# Patient Record
Sex: Female | Born: 1965 | Race: White | Hispanic: No | Marital: Married | Smoking: Never smoker
Health system: Southern US, Community
[De-identification: ages and names within clinical notes are randomized; demographics above are authoritative.]

## PROBLEM LIST (undated history)

## (undated) DIAGNOSIS — G4700 Insomnia, unspecified: Secondary | ICD-10-CM

## (undated) DIAGNOSIS — I10 Essential (primary) hypertension: Secondary | ICD-10-CM

## (undated) DIAGNOSIS — E0590 Thyrotoxicosis, unspecified without thyrotoxic crisis or storm: Secondary | ICD-10-CM

## (undated) DIAGNOSIS — R197 Diarrhea, unspecified: Secondary | ICD-10-CM

## (undated) DIAGNOSIS — E785 Hyperlipidemia, unspecified: Secondary | ICD-10-CM

## (undated) DIAGNOSIS — R109 Unspecified abdominal pain: Secondary | ICD-10-CM

## (undated) DIAGNOSIS — F418 Other specified anxiety disorders: Secondary | ICD-10-CM

## (undated) DIAGNOSIS — E1129 Type 2 diabetes mellitus with other diabetic kidney complication: Secondary | ICD-10-CM

## (undated) DIAGNOSIS — N182 Chronic kidney disease, stage 2 (mild): Secondary | ICD-10-CM

## (undated) DIAGNOSIS — F411 Generalized anxiety disorder: Secondary | ICD-10-CM

## (undated) DIAGNOSIS — E7800 Pure hypercholesterolemia, unspecified: Secondary | ICD-10-CM

## (undated) DIAGNOSIS — E669 Obesity, unspecified: Secondary | ICD-10-CM

## (undated) HISTORY — DX: Obesity, unspecified: E66.9

## (undated) HISTORY — DX: Chronic kidney disease, stage 2 (mild): N18.2

## (undated) HISTORY — DX: Unspecified abdominal pain: R10.9

## (undated) HISTORY — DX: Essential (primary) hypertension: I10

## (undated) HISTORY — DX: Generalized anxiety disorder: F41.1

## (undated) HISTORY — DX: Diarrhea, unspecified: R19.7

## (undated) HISTORY — DX: Pure hypercholesterolemia, unspecified: E78.00

## (undated) HISTORY — PX: CHOLECYSTECTOMY: SHX55

## (undated) HISTORY — DX: Thyrotoxicosis, unspecified without thyrotoxic crisis or storm: E05.90

## (undated) HISTORY — DX: Type 2 diabetes mellitus with other diabetic kidney complication: E11.29

## (undated) HISTORY — DX: Other specified anxiety disorders: F41.8

## (undated) HISTORY — DX: Insomnia, unspecified: G47.00

## (undated) HISTORY — DX: Hyperlipidemia, unspecified: E78.5

---

## 1997-12-08 ENCOUNTER — Encounter: Payer: Self-pay | Admitting: Family Medicine

## 1997-12-08 ENCOUNTER — Ambulatory Visit (HOSPITAL_COMMUNITY): Admission: RE | Admit: 1997-12-08 | Discharge: 1997-12-08 | Payer: Self-pay | Admitting: Family Medicine

## 1998-09-07 ENCOUNTER — Ambulatory Visit (HOSPITAL_COMMUNITY): Admission: RE | Admit: 1998-09-07 | Discharge: 1998-09-07 | Payer: Self-pay | Admitting: Gastroenterology

## 1998-09-22 ENCOUNTER — Encounter: Payer: Self-pay | Admitting: Gastroenterology

## 1998-09-22 ENCOUNTER — Ambulatory Visit (HOSPITAL_COMMUNITY): Admission: RE | Admit: 1998-09-22 | Discharge: 1998-09-22 | Payer: Self-pay | Admitting: Gastroenterology

## 1998-10-13 ENCOUNTER — Ambulatory Visit (HOSPITAL_COMMUNITY): Admission: RE | Admit: 1998-10-13 | Discharge: 1998-10-13 | Payer: Self-pay | Admitting: Urology

## 1998-10-13 ENCOUNTER — Encounter: Payer: Self-pay | Admitting: Urology

## 1998-11-08 ENCOUNTER — Encounter: Payer: Self-pay | Admitting: Urology

## 1998-11-08 ENCOUNTER — Ambulatory Visit (HOSPITAL_COMMUNITY): Admission: RE | Admit: 1998-11-08 | Discharge: 1998-11-08 | Payer: Self-pay | Admitting: Urology

## 1998-12-26 ENCOUNTER — Observation Stay (HOSPITAL_COMMUNITY): Admission: RE | Admit: 1998-12-26 | Discharge: 1998-12-27 | Payer: Self-pay | Admitting: Urology

## 1998-12-26 ENCOUNTER — Encounter: Payer: Self-pay | Admitting: Urology

## 1999-02-08 ENCOUNTER — Encounter: Payer: Self-pay | Admitting: Urology

## 1999-02-08 ENCOUNTER — Ambulatory Visit (HOSPITAL_COMMUNITY): Admission: RE | Admit: 1999-02-08 | Discharge: 1999-02-08 | Payer: Self-pay | Admitting: Urology

## 1999-08-21 ENCOUNTER — Encounter: Payer: Self-pay | Admitting: Emergency Medicine

## 1999-08-21 ENCOUNTER — Emergency Department (HOSPITAL_COMMUNITY): Admission: EM | Admit: 1999-08-21 | Discharge: 1999-08-21 | Payer: Self-pay | Admitting: Emergency Medicine

## 1999-12-25 ENCOUNTER — Encounter: Payer: Self-pay | Admitting: Obstetrics and Gynecology

## 1999-12-25 ENCOUNTER — Ambulatory Visit (HOSPITAL_COMMUNITY): Admission: RE | Admit: 1999-12-25 | Discharge: 1999-12-25 | Payer: Self-pay | Admitting: Obstetrics and Gynecology

## 2000-03-24 ENCOUNTER — Inpatient Hospital Stay (HOSPITAL_COMMUNITY): Admission: AD | Admit: 2000-03-24 | Discharge: 2000-03-24 | Payer: Self-pay | Admitting: Obstetrics and Gynecology

## 2000-05-03 ENCOUNTER — Inpatient Hospital Stay (HOSPITAL_COMMUNITY): Admission: AD | Admit: 2000-05-03 | Discharge: 2000-05-05 | Payer: Self-pay | Admitting: Obstetrics and Gynecology

## 2000-09-03 ENCOUNTER — Encounter: Payer: Self-pay | Admitting: Family Medicine

## 2000-09-03 ENCOUNTER — Ambulatory Visit (HOSPITAL_COMMUNITY): Admission: RE | Admit: 2000-09-03 | Discharge: 2000-09-03 | Payer: Self-pay | Admitting: Family Medicine

## 2000-11-20 ENCOUNTER — Emergency Department (HOSPITAL_COMMUNITY): Admission: EM | Admit: 2000-11-20 | Discharge: 2000-11-20 | Payer: Self-pay | Admitting: Emergency Medicine

## 2000-12-04 ENCOUNTER — Encounter: Payer: Self-pay | Admitting: Family Medicine

## 2000-12-04 ENCOUNTER — Ambulatory Visit (HOSPITAL_COMMUNITY): Admission: RE | Admit: 2000-12-04 | Discharge: 2000-12-04 | Payer: Self-pay | Admitting: Family Medicine

## 2001-06-06 ENCOUNTER — Emergency Department: Admission: EM | Admit: 2001-06-06 | Discharge: 2001-06-07 | Payer: Self-pay | Admitting: Emergency Medicine

## 2001-06-07 ENCOUNTER — Emergency Department (HOSPITAL_COMMUNITY): Admission: EM | Admit: 2001-06-07 | Discharge: 2001-06-07 | Payer: Self-pay | Admitting: Emergency Medicine

## 2001-06-09 ENCOUNTER — Ambulatory Visit (HOSPITAL_COMMUNITY): Admission: RE | Admit: 2001-06-09 | Discharge: 2001-06-09 | Payer: Self-pay | Admitting: Family Medicine

## 2001-06-09 ENCOUNTER — Encounter: Payer: Self-pay | Admitting: Family Medicine

## 2003-01-31 ENCOUNTER — Emergency Department (HOSPITAL_COMMUNITY): Admission: EM | Admit: 2003-01-31 | Discharge: 2003-02-01 | Payer: Self-pay | Admitting: Emergency Medicine

## 2003-09-03 ENCOUNTER — Ambulatory Visit (HOSPITAL_COMMUNITY): Admission: RE | Admit: 2003-09-03 | Discharge: 2003-09-03 | Payer: Self-pay | Admitting: Family Medicine

## 2003-09-10 ENCOUNTER — Ambulatory Visit (HOSPITAL_COMMUNITY): Admission: RE | Admit: 2003-09-10 | Discharge: 2003-09-10 | Payer: Self-pay | Admitting: Family Medicine

## 2003-09-22 ENCOUNTER — Ambulatory Visit (HOSPITAL_COMMUNITY): Admission: RE | Admit: 2003-09-22 | Discharge: 2003-09-22 | Payer: Self-pay | Admitting: Urology

## 2003-10-07 ENCOUNTER — Ambulatory Visit (HOSPITAL_COMMUNITY): Admission: RE | Admit: 2003-10-07 | Discharge: 2003-10-07 | Payer: Self-pay | Admitting: General Surgery

## 2003-10-07 ENCOUNTER — Encounter (INDEPENDENT_AMBULATORY_CARE_PROVIDER_SITE_OTHER): Payer: Self-pay | Admitting: *Deleted

## 2005-04-29 ENCOUNTER — Encounter: Payer: Self-pay | Admitting: Emergency Medicine

## 2005-04-30 ENCOUNTER — Inpatient Hospital Stay (HOSPITAL_COMMUNITY): Admission: AD | Admit: 2005-04-30 | Discharge: 2005-04-30 | Payer: Self-pay | Admitting: Internal Medicine

## 2006-01-04 IMAGING — NM NM RENAL IMAGING FLOW W/ PHARM
1 series · 6 of 6 positions shown · non-contrast
Comparison: none

CLINICAL DATA: right ureteropelvic junction obstruction
 RENAL IMAGING WITH FLOW WITH PHARMACY
 The study was performed after initial injection of 16.2 mCi of ZcYYm MAG3.  20mg of Lasix was injected IV twenty minutes after injection of isotope.
 The study was compared with a previous renogram of 02/08/99.  
 Initial flow study reveals prompt visualization of the collecting system with satisfactory excretion on the left with decreased excretion on the right.
 At three minutes a renal cell mass on the left is 46.7% and on the right 53.3%.
 Flow study reveals satisfactory perfusion of both kidneys.
 There is satisfactory excretion of the left kidney before and after Lasix.  On the right no excretion is present prior to Lasix.  After Lasix there is satisfactory excretion.  This is similar to the previous study.  Clearance results reveal T [DATE] of the Lasix on the left to be three minutes and on the right to be eleven minutes.  On the previous study the T [DATE] on the right was six minutes.
 IMPRESSION
 When compared to previous study of 02/08/99 again there satisfactory excretion of the left kidney.  The right kidney as on the previous study no excretion is noted prior to Lasix.  After Lasix there is noted to be more delay in excretion of the right kidney when compared to the previous study.  T [DATE] previously on the right was six minutes and on the present study is eleven minutes.

[renal scan · 8.64mm/px · 6 of 40 frames shown]
[frame 4/40]
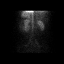
[frame 10/40]
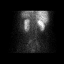
[frame 17/40]
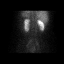
[frame 24/40]
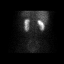
[frame 30/40]
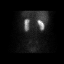
[frame 37/40]
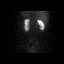

[6 of 6 positions shown; findings below may reference images not displayed]

## 2007-11-06 ENCOUNTER — Encounter: Admission: RE | Admit: 2007-11-06 | Discharge: 2007-11-06 | Payer: Self-pay | Admitting: Orthopedic Surgery

## 2010-02-08 ENCOUNTER — Other Ambulatory Visit
Admission: RE | Admit: 2010-02-08 | Discharge: 2010-02-08 | Payer: Self-pay | Source: Home / Self Care | Admitting: Family Medicine

## 2010-02-08 ENCOUNTER — Other Ambulatory Visit: Payer: Self-pay | Admitting: Family Medicine

## 2010-06-09 NOTE — Discharge Summary (Signed)
[facility]  Patient:    [name], [name]                       MRN: [id number] Adm. Date:  [id number] Disc. Date: 05/05/00 Attending:  Michaele Offer                           Discharge Summary  ADMITTING DIAGNOSIS:          Intrauterine pregnancy at 36 weeks, preterm labor.  DISCHARGE DIAGNOSIS:          Intrauterine pregnancy at 36 weeks, preterm labor.  PROCEDURE:                    Spontaneous vaginal delivery.  COMPLICATIONS:                None.  CONSULTANTS:                  None.  HISTORY OF PRESENT ILLNESS:   This is a 45 year old white female, gravida 5, para 3-0-1-3, with an EGA of 36+ weeks by seven week ultrasound with an [facility] of May 27, 2000 who presents with a complaint of regular contraction without bleeding or rupture membranes, and with good fetal movement.  Vaginal exam two days ago was 3 cm dilated and today in the office she was 4 cm dilated and in triage she progressed to 6 to 7 cm dilated.  Prenatal care was subsequently uncomplicated.  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  Prenatal labs show blood type is O positive with a negative antibody screen.  RPR nonreactive.  Rubella immuned.  Hepatitis B surface-antigen negative.  HIV negative.  Gonorrhea and chlamydia negative.  Triple screen normal.  One hour Glucola 131 and group B strep is negative.  PAST OBSTETRIC HISTORY:       In 1988, spontaneous abortion.  In 1989, vaginal delivery at 40 weeks, 6 pounds 11 ounces, no complications.  In 1991, vaginal delivery at 40 weeks, 7 pounds 3 ounces, no complications.  In 1998, vaginal delivery at 37 weeks, 6 pounds 7 ounces, no complications.  GYNECOLOGIC HISTORY:          Rare HSV.  PAST MEDICAL HISTORY:         Fractured coccyx.  PAST SURGICAL HISTORY:        Tonsillectomy, eye surgery, D&C after the spontaneous abortion.  Removal of her coccyx and a right ureteral stent.   The remainder of her history is noncontributory.  PHYSICAL EXAMINATION:  VITAL SIGNS:                  She was afebrile with stable vital signs.  Fetal heart tracing was reactive with contractions every three to five minutes.  ABDOMEN:                      Gravid and nontender with estimated fetal weight of 7 pounds.  VAGINAL:                      By myself, she was 650 and 0 with a vertex presentation and an adequate pelvis, and artificial rupture of membranes revealed clear amniotic fluid.  HOSPITAL COURSE:              The patient was admitted and initially labored on her own.  She  had a reactive tracing and was allowed to walk.  After two episodes of walking and making no progress, I was able to examine her and perform artificial rupture of membranes.  This did not significant improve her contractions or help her make any progress.  She was then started on Pitocin augmentation and received and epidural.  She finally progressed rapidly to complete and pushed well.  Early on the morning of May 04, 2000, she had an SVD of a viable female infant without Apgars of 8 and 9 and weight 6 pounds 13 ounces over an intact perineum.  There was a loose nuchal cord x 1 which was reduced.  Placenta delivered spontaneously and intact.  The perineum was intact and estimated blood loss was less than 500 cc.  Postpartum, she did very well.  Breast-fed her baby without complications.  A lower hemoglobin, it was 12.8 postdelivery and was 11.6.  On the morning of postpartum day #1, the patient requested discharge home. She was felt to be stable enough for discharge.  CONDITION ON DISCHARGE:       Stable.  DISPOSITION:                  Discharge to home.  DISCHARGE INSTRUCTIONS:       Regular diet.  Her activity is pelvic rest.  Her follow-up is in four to six weeks and she is given our discharge pamphlet. DD:  05/05/00 TD:  05/05/00 Job: [id number]

## 2010-06-09 NOTE — Op Note (Signed)
[name], [name]                          ACCOUNT NO.:  [id number]   MEDICAL RECORD NO.:  [id number]                   PATIENT TYPE:  AMB   LOCATION:  DAY                                  FACILITY:  [facility]   PHYSICIAN:  [name], M.D.               DATE OF BIRTH:  February 11, 1965   DATE OF PROCEDURE:  10/07/2003  DATE OF DISCHARGE:                                 OPERATIVE REPORT   PREOPERATIVE DIAGNOSIS:  Symptomatic cholelithiasis.   POSTOPERATIVE DIAGNOSIS:  Symptomatic cholelithiasis.   PROCEDURE:  Laparoscopic cholecystectomy with intraoperative cholangiogram.   SURGEON:  Dr. Lindie Bass   ASSISTANT:  Dr. Carman Bass   ANESTHESIA:  General endotracheal.   ESTIMATED BLOOD LOSS:  Less than 20 mL.   COMPLICATIONS:  None.   CONDITION:  Good.   INDICATION FOR OPERATION:  The patient is a 45 year old with abdominal pain  in right upper quadrant pain and gallstones, who now comes in for an  elective laparoscopic cholecystectomy.   FINDINGS:  On initial visualization of the gallbladder, there were omental  adhesions to the body which had to be taken down with evidence of  inflammation of the infundibulum.  The cholangiogram showed normal flow into  the duodenum, good proximal filling, and no evidence of intraductal stones  or common duct dilatation.   OPERATION:  The patient was taken to the operating room, placed on the table  in a supine position.  After an adequate endotracheal anesthetic was  administered, she was prepped and draped in the usual sterile manner after  the patient had undergone cystoscopy and manipulation by Dr. Patsi Bass.   A supraumbilical curvilinear incision was made with the patient in reverse  Trendelenburg position.  This was taken down to the midline fascia through  which a Veress needle was passed into the peritoneal cavity.  It was  confirmed to be in position using the saline test and once this was done,  carbon dioxide insufflation was  instilled into the peritoneal cavity up to a  maximal intra-abdominal pressure of 15 mmHg.  When this was done, a 10-11 mm  trocar and cannula with an OptiView port was passed through the  supraumbilical fascia, into the peritoneal cavity under direct vision.  We  removed the inner trocar and then subsequently had the cannula in place  through which we inspected insertion of two right costal margin 5 mm  cannulas and a subxiphoid 10-11 mm cannula under direct vision.  The patient  was placed in steep reverse Trendelenburg.  Then the left side was tilted  down.   The dome of the gallbladder was grasped with a ratcheted grasper through the  lateral-most cannula, and the omentum which was attached to the body was  taken down using electrocautery and blunt dissection.  This helped [place] to  expose the infundibulum which upon retracting it inferolaterally, exposed  the peritoneum overlying the triangle of Calot  and hepatoduodenal triangle.  We were able to identify the cystic duct and the cystic artery in this area  along with a large cystic duct lymph node.  We inspected the cystic duct and  dissected it out using right-angle clamps and blunt dissection and  subsequently placed a proximal clip along the gallbladder side.  A  cholecystoductotomy was made using laparoscopic scissors through which a  Cook catheter, which had been passed through the anterior abdominal wall,  was passed.  We did our cholangiogram through the [facility] catheter which  demonstrated the findings noted previously; however, overall, it was normal.  Once it was done, the patient was placed back into the operating position.  The [facility] catheter was removed.  The distal cystic duct was clipped with 3  Endo Clips and then transected.  The cystic duct was then found which  coursed along with the cystic duct lymph node and clipped proximally and  distally.  We transected the cystic artery and then dissected out the  gallbladder from  its bed with minimal difficulty, although there was a small  puncture of the gallbladder along with leakage of bowel.  We were able to  remove it through the supraumbilical site using an EndoCatch bag.   Once the gallbladder was removed, we used 1 L of saline to irrigate out the  spilled bile from the opening that was done during the dissection.  There  was minimal to no bleeding from the gallbladder bed.  Once the gallbladder  had been removed and we had had adequate hemostasis, we removed all  cannulas.  There was minimal bleeding.  The supraumbilical fascia was closed  using a figure-of-eight stitch of 0 Vicryl passed on a UR-6 needle and then  the skin and all sites closed using a running subcuticular stitch of 4-0  Vicryl.  Marcaine 0.25% with epinephrine was injected in all sites and then  sterile dressings applied.  All needle counts, sponge counts, and instrument  counts were correct at the conclusion of the case.                                               [name], M.D.    JW/MEDQ  D:  10/07/2003  T:  10/07/2003  Job:  [id number]

## 2010-06-09 NOTE — Op Note (Signed)
NAME:  [name], [name]                          ACCOUNT NO.:  [id number]   MEDICAL RECORD NO.:  [id number]                   PATIENT TYPE:  AMB   LOCATION:  DAY                                  FACILITY:  [facility]   PHYSICIAN:  Sigmund I. Patsi Sears, M.D.         DATE OF BIRTH:  1965-11-29   DATE OF PROCEDURE:  10/07/2003  DATE OF DISCHARGE:                                 OPERATIVE REPORT   PREOPERATIVE DIAGNOSIS:  Dilated right renal unit.   POSTOPERATIVE DIAGNOSIS:  Normally draining right renal unit, status post  endopyelotomy.   OPERATION:  1.  Cystourethroscopy.  2.  Right retrograde pyelogram with interpretation.  3.  Right ureteroscopy.  4.  Right double-J catheter.   SURGEON:  Sigmund I. Patsi Sears, M.D.   ANESTHESIA:  General LMA.   PREPARATION:  After appropriate preanesthesia, the patient is brought to the  operating room and placed on the operating table in the dorsal supine  position where general LMA anesthesia was introduced.  She was then re-  placed in dorsal lithotomy position, where the pubis was prepped with  Betadine solution and draped in usual fashion.   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  During evaluation for  gallstones, she was found to have right-sided hydronephrosis, was considered  for open pyeloplasty.  However, reevaluation of the CT scan showed that the  patient had hydronephrosis down to the mid ureter.  She is now for a  retrograde pyelography and ureteroscopy and double-J catheter stenting  during her procedure for cholecystectomy.   DESCRIPTION OF PROCEDURE:  Cystourethroscopy is accomplished and shows a  normal-appearing bladder.  Right retrograde pyelogram is performed and shows  apparent hydronephrosis with dilated ureter.  However, fluoroscopic  evaluation shows excellent drainage of both the renal pelvis and of the mid  ureter.  It is felt that the  patient had excellent endopyelotomy with  excellent drainage.  Ureteroscopy is accomplished which shows that there is  normal ureteral mucosa all the way into the renal pelvis.  The entire renal  pelvis is evaluation, and there is no evidence of any obstruction.   Double-J catheter was left, removed in the office.  The patient then  underwent laparoscopic cholecystectomy for Dr. Lindie Spruce.                                               Sigmund I. Patsi Sears, M.D.    SIT/MEDQ  D:  10/07/2003  T:  10/07/2003  Job:  [id number]   cc:   Jimmye Norman III, M.D.  [address], [address]  [place]  [place] [address]  Fax: [phone]

## 2010-06-20 ENCOUNTER — Other Ambulatory Visit: Payer: Self-pay | Admitting: Gastroenterology

## 2010-08-14 ENCOUNTER — Other Ambulatory Visit: Payer: Self-pay | Admitting: Gastroenterology

## 2010-08-17 ENCOUNTER — Ambulatory Visit
Admission: RE | Admit: 2010-08-17 | Discharge: 2010-08-17 | Disposition: A | Discharge status: Home / Self Care | Payer: Medicaid Other | Source: Ambulatory Visit | Attending: Gastroenterology | Admitting: Gastroenterology

## 2010-08-17 MED ORDER — IOHEXOL 300 MG/ML  SOLN
100.0000 mL | Freq: Once | INTRAMUSCULAR | Status: AC | PRN
  Administered 2010-08-17: 100 mL via INTRAVENOUS

## 2010-09-15 ENCOUNTER — Other Ambulatory Visit: Payer: Self-pay | Admitting: Orthopedic Surgery

## 2010-09-15 DIAGNOSIS — M545 Low back pain: Secondary | ICD-10-CM

## 2010-10-02 ENCOUNTER — Other Ambulatory Visit: Payer: Medicaid Other

## 2010-10-16 ENCOUNTER — Other Ambulatory Visit: Payer: Medicaid Other

## 2010-10-23 ENCOUNTER — Inpatient Hospital Stay: Admission: RE | Admit: 2010-10-23 | Payer: Medicaid Other | Source: Ambulatory Visit

## 2010-10-30 ENCOUNTER — Other Ambulatory Visit: Payer: Medicaid Other

## 2010-10-31 ENCOUNTER — Inpatient Hospital Stay: Admission: RE | Admit: 2010-10-31 | Payer: Medicaid Other | Source: Ambulatory Visit

## 2012-08-11 ENCOUNTER — Other Ambulatory Visit (HOSPITAL_COMMUNITY): Payer: Self-pay | Admitting: Urology

## 2012-08-11 DIAGNOSIS — N3020 Other chronic cystitis without hematuria: Secondary | ICD-10-CM | POA: Diagnosis not present

## 2012-08-11 DIAGNOSIS — N135 Crossing vessel and stricture of ureter without hydronephrosis: Secondary | ICD-10-CM

## 2012-08-18 ENCOUNTER — Encounter (HOSPITAL_COMMUNITY): Admission: RE | Admit: 2012-08-18 | Payer: 59 | Source: Ambulatory Visit

## 2012-08-22 DIAGNOSIS — E119 Type 2 diabetes mellitus without complications: Secondary | ICD-10-CM | POA: Diagnosis not present

## 2012-09-16 DIAGNOSIS — E119 Type 2 diabetes mellitus without complications: Secondary | ICD-10-CM | POA: Diagnosis not present

## 2012-09-16 DIAGNOSIS — I10 Essential (primary) hypertension: Secondary | ICD-10-CM | POA: Diagnosis not present

## 2012-09-16 DIAGNOSIS — E785 Hyperlipidemia, unspecified: Secondary | ICD-10-CM | POA: Diagnosis not present

## 2012-09-16 DIAGNOSIS — G4700 Insomnia, unspecified: Secondary | ICD-10-CM | POA: Diagnosis not present

## 2012-09-16 DIAGNOSIS — F411 Generalized anxiety disorder: Secondary | ICD-10-CM | POA: Diagnosis not present

## 2013-02-27 DIAGNOSIS — N182 Chronic kidney disease, stage 2 (mild): Secondary | ICD-10-CM | POA: Diagnosis not present

## 2013-02-27 DIAGNOSIS — G4700 Insomnia, unspecified: Secondary | ICD-10-CM | POA: Diagnosis not present

## 2013-02-27 DIAGNOSIS — I10 Essential (primary) hypertension: Secondary | ICD-10-CM | POA: Diagnosis not present

## 2013-02-27 DIAGNOSIS — E1129 Type 2 diabetes mellitus with other diabetic kidney complication: Secondary | ICD-10-CM | POA: Diagnosis not present

## 2013-02-27 DIAGNOSIS — F411 Generalized anxiety disorder: Secondary | ICD-10-CM | POA: Diagnosis not present

## 2013-02-27 DIAGNOSIS — E785 Hyperlipidemia, unspecified: Secondary | ICD-10-CM | POA: Diagnosis not present

## 2013-09-07 DIAGNOSIS — M545 Low back pain, unspecified: Secondary | ICD-10-CM | POA: Diagnosis not present

## 2013-09-07 DIAGNOSIS — F411 Generalized anxiety disorder: Secondary | ICD-10-CM | POA: Diagnosis not present

## 2013-09-07 DIAGNOSIS — I10 Essential (primary) hypertension: Secondary | ICD-10-CM | POA: Diagnosis not present

## 2013-09-07 DIAGNOSIS — G4700 Insomnia, unspecified: Secondary | ICD-10-CM | POA: Diagnosis not present

## 2013-09-07 DIAGNOSIS — E1129 Type 2 diabetes mellitus with other diabetic kidney complication: Secondary | ICD-10-CM | POA: Diagnosis not present

## 2013-09-07 DIAGNOSIS — N182 Chronic kidney disease, stage 2 (mild): Secondary | ICD-10-CM | POA: Diagnosis not present

## 2013-09-07 DIAGNOSIS — E785 Hyperlipidemia, unspecified: Secondary | ICD-10-CM | POA: Diagnosis not present

## 2016-04-30 ENCOUNTER — Other Ambulatory Visit: Payer: Self-pay | Admitting: Family Medicine

## 2016-04-30 ENCOUNTER — Ambulatory Visit
Admission: RE | Admit: 2016-04-30 | Discharge: 2016-04-30 | Disposition: A | Discharge status: Home / Self Care | Payer: Medicare PPO | Source: Ambulatory Visit | Attending: Family Medicine | Admitting: Family Medicine

## 2016-04-30 DIAGNOSIS — M79645 Pain in left finger(s): Secondary | ICD-10-CM

## 2016-12-03 DIAGNOSIS — I10 Essential (primary) hypertension: Secondary | ICD-10-CM | POA: Diagnosis not present

## 2016-12-03 DIAGNOSIS — F411 Generalized anxiety disorder: Secondary | ICD-10-CM | POA: Diagnosis not present

## 2016-12-03 DIAGNOSIS — G4700 Insomnia, unspecified: Secondary | ICD-10-CM | POA: Diagnosis not present

## 2016-12-03 DIAGNOSIS — E785 Hyperlipidemia, unspecified: Secondary | ICD-10-CM | POA: Diagnosis not present

## 2016-12-03 DIAGNOSIS — E1122 Type 2 diabetes mellitus with diabetic chronic kidney disease: Secondary | ICD-10-CM | POA: Diagnosis not present

## 2016-12-03 DIAGNOSIS — Z23 Encounter for immunization: Secondary | ICD-10-CM | POA: Diagnosis not present

## 2016-12-03 DIAGNOSIS — Z1389 Encounter for screening for other disorder: Secondary | ICD-10-CM | POA: Diagnosis not present

## 2016-12-03 DIAGNOSIS — N182 Chronic kidney disease, stage 2 (mild): Secondary | ICD-10-CM | POA: Diagnosis not present

## 2016-12-25 DIAGNOSIS — R1031 Right lower quadrant pain: Secondary | ICD-10-CM | POA: Diagnosis not present

## 2017-02-06 DIAGNOSIS — N921 Excessive and frequent menstruation with irregular cycle: Secondary | ICD-10-CM | POA: Diagnosis not present

## 2017-02-21 DIAGNOSIS — N921 Excessive and frequent menstruation with irregular cycle: Secondary | ICD-10-CM | POA: Diagnosis not present

## 2017-03-01 ENCOUNTER — Other Ambulatory Visit: Payer: Self-pay | Admitting: Obstetrics & Gynecology

## 2017-03-01 DIAGNOSIS — N841 Polyp of cervix uteri: Secondary | ICD-10-CM | POA: Diagnosis not present

## 2017-03-01 DIAGNOSIS — Z6833 Body mass index (BMI) 33.0-33.9, adult: Secondary | ICD-10-CM | POA: Diagnosis not present

## 2017-03-01 DIAGNOSIS — N858 Other specified noninflammatory disorders of uterus: Secondary | ICD-10-CM | POA: Diagnosis not present

## 2017-03-01 DIAGNOSIS — E1122 Type 2 diabetes mellitus with diabetic chronic kidney disease: Secondary | ICD-10-CM | POA: Diagnosis not present

## 2017-03-01 DIAGNOSIS — E669 Obesity, unspecified: Secondary | ICD-10-CM | POA: Diagnosis not present

## 2017-03-01 DIAGNOSIS — I10 Essential (primary) hypertension: Secondary | ICD-10-CM | POA: Diagnosis not present

## 2017-03-01 DIAGNOSIS — N926 Irregular menstruation, unspecified: Secondary | ICD-10-CM | POA: Diagnosis not present

## 2017-03-01 DIAGNOSIS — Z7984 Long term (current) use of oral hypoglycemic drugs: Secondary | ICD-10-CM | POA: Diagnosis not present

## 2017-04-01 DIAGNOSIS — D251 Intramural leiomyoma of uterus: Secondary | ICD-10-CM | POA: Diagnosis not present

## 2017-04-01 DIAGNOSIS — I10 Essential (primary) hypertension: Secondary | ICD-10-CM | POA: Diagnosis not present

## 2017-04-01 DIAGNOSIS — N939 Abnormal uterine and vaginal bleeding, unspecified: Secondary | ICD-10-CM | POA: Diagnosis not present

## 2017-04-09 DIAGNOSIS — J4521 Mild intermittent asthma with (acute) exacerbation: Secondary | ICD-10-CM | POA: Diagnosis not present

## 2017-04-09 DIAGNOSIS — R509 Fever, unspecified: Secondary | ICD-10-CM | POA: Diagnosis not present

## 2017-06-04 DIAGNOSIS — F411 Generalized anxiety disorder: Secondary | ICD-10-CM | POA: Diagnosis not present

## 2017-06-04 DIAGNOSIS — G4700 Insomnia, unspecified: Secondary | ICD-10-CM | POA: Diagnosis not present

## 2017-06-04 DIAGNOSIS — Z1389 Encounter for screening for other disorder: Secondary | ICD-10-CM | POA: Diagnosis not present

## 2017-06-04 DIAGNOSIS — N182 Chronic kidney disease, stage 2 (mild): Secondary | ICD-10-CM | POA: Diagnosis not present

## 2017-06-04 DIAGNOSIS — E785 Hyperlipidemia, unspecified: Secondary | ICD-10-CM | POA: Diagnosis not present

## 2017-06-04 DIAGNOSIS — I10 Essential (primary) hypertension: Secondary | ICD-10-CM | POA: Diagnosis not present

## 2017-06-04 DIAGNOSIS — E1122 Type 2 diabetes mellitus with diabetic chronic kidney disease: Secondary | ICD-10-CM | POA: Diagnosis not present

## 2017-06-04 DIAGNOSIS — Z0000 Encounter for general adult medical examination without abnormal findings: Secondary | ICD-10-CM | POA: Diagnosis not present

## 2017-06-04 DIAGNOSIS — K635 Polyp of colon: Secondary | ICD-10-CM | POA: Diagnosis not present

## 2017-08-01 DIAGNOSIS — R87610 Atypical squamous cells of undetermined significance on cytologic smear of cervix (ASC-US): Secondary | ICD-10-CM | POA: Diagnosis not present

## 2017-08-01 DIAGNOSIS — Z01411 Encounter for gynecological examination (general) (routine) with abnormal findings: Secondary | ICD-10-CM | POA: Diagnosis not present

## 2017-08-01 DIAGNOSIS — N939 Abnormal uterine and vaginal bleeding, unspecified: Secondary | ICD-10-CM | POA: Diagnosis not present

## 2017-08-01 DIAGNOSIS — D251 Intramural leiomyoma of uterus: Secondary | ICD-10-CM | POA: Diagnosis not present

## 2017-08-01 DIAGNOSIS — I10 Essential (primary) hypertension: Secondary | ICD-10-CM | POA: Diagnosis not present

## 2017-12-06 DIAGNOSIS — Z23 Encounter for immunization: Secondary | ICD-10-CM | POA: Diagnosis not present

## 2017-12-06 DIAGNOSIS — Z6836 Body mass index (BMI) 36.0-36.9, adult: Secondary | ICD-10-CM | POA: Diagnosis not present

## 2017-12-06 DIAGNOSIS — F411 Generalized anxiety disorder: Secondary | ICD-10-CM | POA: Diagnosis not present

## 2017-12-06 DIAGNOSIS — E1122 Type 2 diabetes mellitus with diabetic chronic kidney disease: Secondary | ICD-10-CM | POA: Diagnosis not present

## 2017-12-06 DIAGNOSIS — I10 Essential (primary) hypertension: Secondary | ICD-10-CM | POA: Diagnosis not present

## 2017-12-06 DIAGNOSIS — N182 Chronic kidney disease, stage 2 (mild): Secondary | ICD-10-CM | POA: Diagnosis not present

## 2017-12-06 DIAGNOSIS — G4700 Insomnia, unspecified: Secondary | ICD-10-CM | POA: Diagnosis not present

## 2017-12-06 DIAGNOSIS — E785 Hyperlipidemia, unspecified: Secondary | ICD-10-CM | POA: Diagnosis not present

## 2017-12-30 DIAGNOSIS — M25561 Pain in right knee: Secondary | ICD-10-CM | POA: Diagnosis not present

## 2017-12-30 DIAGNOSIS — M1711 Unilateral primary osteoarthritis, right knee: Secondary | ICD-10-CM | POA: Diagnosis not present

## 2018-04-07 DIAGNOSIS — B349 Viral infection, unspecified: Secondary | ICD-10-CM | POA: Diagnosis not present

## 2018-06-06 DIAGNOSIS — Z1389 Encounter for screening for other disorder: Secondary | ICD-10-CM | POA: Diagnosis not present

## 2018-06-06 DIAGNOSIS — F411 Generalized anxiety disorder: Secondary | ICD-10-CM | POA: Diagnosis not present

## 2018-06-06 DIAGNOSIS — N182 Chronic kidney disease, stage 2 (mild): Secondary | ICD-10-CM | POA: Diagnosis not present

## 2018-06-06 DIAGNOSIS — E1122 Type 2 diabetes mellitus with diabetic chronic kidney disease: Secondary | ICD-10-CM | POA: Diagnosis not present

## 2018-06-06 DIAGNOSIS — Z7984 Long term (current) use of oral hypoglycemic drugs: Secondary | ICD-10-CM | POA: Diagnosis not present

## 2018-06-06 DIAGNOSIS — I10 Essential (primary) hypertension: Secondary | ICD-10-CM | POA: Diagnosis not present

## 2018-06-06 DIAGNOSIS — E785 Hyperlipidemia, unspecified: Secondary | ICD-10-CM | POA: Diagnosis not present

## 2018-06-06 DIAGNOSIS — Z0000 Encounter for general adult medical examination without abnormal findings: Secondary | ICD-10-CM | POA: Diagnosis not present

## 2018-06-06 DIAGNOSIS — G4700 Insomnia, unspecified: Secondary | ICD-10-CM | POA: Diagnosis not present

## 2018-06-06 DIAGNOSIS — Z86010 Personal history of colonic polyps: Secondary | ICD-10-CM | POA: Diagnosis not present

## 2018-12-17 DIAGNOSIS — I10 Essential (primary) hypertension: Secondary | ICD-10-CM | POA: Diagnosis not present

## 2018-12-17 DIAGNOSIS — F411 Generalized anxiety disorder: Secondary | ICD-10-CM | POA: Diagnosis not present

## 2018-12-17 DIAGNOSIS — E785 Hyperlipidemia, unspecified: Secondary | ICD-10-CM | POA: Diagnosis not present

## 2018-12-17 DIAGNOSIS — E6609 Other obesity due to excess calories: Secondary | ICD-10-CM | POA: Diagnosis not present

## 2018-12-17 DIAGNOSIS — E1122 Type 2 diabetes mellitus with diabetic chronic kidney disease: Secondary | ICD-10-CM | POA: Diagnosis not present

## 2018-12-17 DIAGNOSIS — G4700 Insomnia, unspecified: Secondary | ICD-10-CM | POA: Diagnosis not present

## 2018-12-17 DIAGNOSIS — N182 Chronic kidney disease, stage 2 (mild): Secondary | ICD-10-CM | POA: Diagnosis not present

## 2018-12-22 DIAGNOSIS — I10 Essential (primary) hypertension: Secondary | ICD-10-CM | POA: Diagnosis not present

## 2018-12-22 DIAGNOSIS — E1122 Type 2 diabetes mellitus with diabetic chronic kidney disease: Secondary | ICD-10-CM | POA: Diagnosis not present

## 2018-12-22 DIAGNOSIS — E785 Hyperlipidemia, unspecified: Secondary | ICD-10-CM | POA: Diagnosis not present

## 2019-04-17 ENCOUNTER — Ambulatory Visit: Payer: Medicare Other | Attending: Internal Medicine

## 2019-04-17 DIAGNOSIS — Z23 Encounter for immunization: Secondary | ICD-10-CM

## 2019-04-17 NOTE — Progress Notes (Signed)
   Covid-19 Vaccination Clinic  Name:  [name]    MRN: [id number] DOB: May 18, 1965  04/17/2019  [name] was observed post Covid-19 immunization for 15 minutes without incident. She was provided with Vaccine Information Sheet and instruction to access the V-Safe system.   [name] was instructed to call 911 with any severe reactions post vaccine: Marland Kitchen Difficulty breathing  . Swelling of face and throat  . A fast heartbeat  . A bad rash all over body  . Dizziness and weakness   Immunizations Administered    Name Date Dose VIS Date Route   Pfizer COVID-19 Vaccine 04/17/2019  2:46 PM 0.3 mL 01/02/2019 Intramuscular   Manufacturer: [employer], [employer]   Lot: [id number]   NDC: [address]-3

## 2019-05-13 ENCOUNTER — Ambulatory Visit: Payer: Medicare Other | Attending: Internal Medicine

## 2019-05-13 DIAGNOSIS — Z23 Encounter for immunization: Secondary | ICD-10-CM

## 2019-05-13 NOTE — Progress Notes (Signed)
   Covid-19 Vaccination Clinic  Name:  [name]    MRN: [id number] DOB: [birth year]  05/13/2019  [name] was observed post Covid-19 immunization for 15 minutes without incident. She was provided with Vaccine Information Sheet and instruction to access the V-Safe system.   [name] was instructed to call 911 with any severe reactions post vaccine: Marland Kitchen Difficulty breathing  . Swelling of face and throat  . A fast heartbeat  . A bad rash all over body  . Dizziness and weakness   Immunizations Administered    Name Date Dose VIS Date Route   Pfizer COVID-19 Vaccine 05/13/2019  2:38 PM 0.3 mL 03/18/2018 Intramuscular   Manufacturer: [employer], [employer]   Lot: [id number]   NDC: [address]-2

## 2020-01-21 ENCOUNTER — Ambulatory Visit: Payer: Medicare Other

## 2020-02-22 DIAGNOSIS — E119 Type 2 diabetes mellitus without complications: Secondary | ICD-10-CM | POA: Diagnosis not present

## 2020-02-22 DIAGNOSIS — M1990 Unspecified osteoarthritis, unspecified site: Secondary | ICD-10-CM | POA: Diagnosis not present

## 2020-02-22 DIAGNOSIS — M79643 Pain in unspecified hand: Secondary | ICD-10-CM | POA: Diagnosis not present

## 2020-02-22 DIAGNOSIS — M791 Myalgia, unspecified site: Secondary | ICD-10-CM | POA: Diagnosis not present

## 2020-02-22 DIAGNOSIS — M4690 Unspecified inflammatory spondylopathy, site unspecified: Secondary | ICD-10-CM | POA: Diagnosis not present

## 2020-02-22 DIAGNOSIS — M549 Dorsalgia, unspecified: Secondary | ICD-10-CM | POA: Diagnosis not present

## 2020-02-22 DIAGNOSIS — R768 Other specified abnormal immunological findings in serum: Secondary | ICD-10-CM | POA: Diagnosis not present

## 2020-02-22 DIAGNOSIS — Z79899 Other long term (current) drug therapy: Secondary | ICD-10-CM | POA: Diagnosis not present

## 2020-02-22 DIAGNOSIS — M7989 Other specified soft tissue disorders: Secondary | ICD-10-CM | POA: Diagnosis not present

## 2020-02-22 DIAGNOSIS — M461 Sacroiliitis, not elsewhere classified: Secondary | ICD-10-CM | POA: Diagnosis not present

## 2020-02-29 DIAGNOSIS — Z79899 Other long term (current) drug therapy: Secondary | ICD-10-CM | POA: Diagnosis not present

## 2020-02-29 DIAGNOSIS — M1990 Unspecified osteoarthritis, unspecified site: Secondary | ICD-10-CM | POA: Diagnosis not present

## 2020-02-29 DIAGNOSIS — M2550 Pain in unspecified joint: Secondary | ICD-10-CM | POA: Diagnosis not present

## 2020-03-10 DIAGNOSIS — Z23 Encounter for immunization: Secondary | ICD-10-CM | POA: Diagnosis not present

## 2020-04-12 DIAGNOSIS — M542 Cervicalgia: Secondary | ICD-10-CM | POA: Diagnosis not present

## 2020-04-18 DIAGNOSIS — Z79899 Other long term (current) drug therapy: Secondary | ICD-10-CM | POA: Diagnosis not present

## 2020-04-18 DIAGNOSIS — R768 Other specified abnormal immunological findings in serum: Secondary | ICD-10-CM | POA: Diagnosis not present

## 2020-04-18 DIAGNOSIS — M7989 Other specified soft tissue disorders: Secondary | ICD-10-CM | POA: Diagnosis not present

## 2020-04-18 DIAGNOSIS — M549 Dorsalgia, unspecified: Secondary | ICD-10-CM | POA: Diagnosis not present

## 2020-04-18 DIAGNOSIS — M1990 Unspecified osteoarthritis, unspecified site: Secondary | ICD-10-CM | POA: Diagnosis not present

## 2020-04-18 DIAGNOSIS — M461 Sacroiliitis, not elsewhere classified: Secondary | ICD-10-CM | POA: Diagnosis not present

## 2020-04-18 DIAGNOSIS — M791 Myalgia, unspecified site: Secondary | ICD-10-CM | POA: Diagnosis not present

## 2020-04-18 DIAGNOSIS — M4690 Unspecified inflammatory spondylopathy, site unspecified: Secondary | ICD-10-CM | POA: Diagnosis not present

## 2020-04-18 DIAGNOSIS — E119 Type 2 diabetes mellitus without complications: Secondary | ICD-10-CM | POA: Diagnosis not present

## 2020-04-18 DIAGNOSIS — M79643 Pain in unspecified hand: Secondary | ICD-10-CM | POA: Diagnosis not present

## 2020-04-20 DIAGNOSIS — M1990 Unspecified osteoarthritis, unspecified site: Secondary | ICD-10-CM | POA: Diagnosis not present

## 2020-04-25 DIAGNOSIS — R748 Abnormal levels of other serum enzymes: Secondary | ICD-10-CM | POA: Diagnosis not present

## 2020-05-16 DIAGNOSIS — J209 Acute bronchitis, unspecified: Secondary | ICD-10-CM | POA: Diagnosis not present

## 2020-05-16 DIAGNOSIS — R509 Fever, unspecified: Secondary | ICD-10-CM | POA: Diagnosis not present

## 2020-05-25 DIAGNOSIS — R768 Other specified abnormal immunological findings in serum: Secondary | ICD-10-CM | POA: Diagnosis not present

## 2020-05-25 DIAGNOSIS — M791 Myalgia, unspecified site: Secondary | ICD-10-CM | POA: Diagnosis not present

## 2020-05-25 DIAGNOSIS — M4690 Unspecified inflammatory spondylopathy, site unspecified: Secondary | ICD-10-CM | POA: Diagnosis not present

## 2020-05-25 DIAGNOSIS — M79643 Pain in unspecified hand: Secondary | ICD-10-CM | POA: Diagnosis not present

## 2020-05-25 DIAGNOSIS — M1990 Unspecified osteoarthritis, unspecified site: Secondary | ICD-10-CM | POA: Diagnosis not present

## 2020-05-25 DIAGNOSIS — M7989 Other specified soft tissue disorders: Secondary | ICD-10-CM | POA: Diagnosis not present

## 2020-05-25 DIAGNOSIS — M459 Ankylosing spondylitis of unspecified sites in spine: Secondary | ICD-10-CM | POA: Diagnosis not present

## 2020-05-25 DIAGNOSIS — Z79899 Other long term (current) drug therapy: Secondary | ICD-10-CM | POA: Diagnosis not present

## 2020-05-25 DIAGNOSIS — M549 Dorsalgia, unspecified: Secondary | ICD-10-CM | POA: Diagnosis not present

## 2020-05-25 DIAGNOSIS — M461 Sacroiliitis, not elsewhere classified: Secondary | ICD-10-CM | POA: Diagnosis not present

## 2020-05-25 DIAGNOSIS — E119 Type 2 diabetes mellitus without complications: Secondary | ICD-10-CM | POA: Diagnosis not present

## 2020-05-30 DIAGNOSIS — N182 Chronic kidney disease, stage 2 (mild): Secondary | ICD-10-CM | POA: Diagnosis not present

## 2020-05-30 DIAGNOSIS — I10 Essential (primary) hypertension: Secondary | ICD-10-CM | POA: Diagnosis not present

## 2020-05-30 DIAGNOSIS — Z0000 Encounter for general adult medical examination without abnormal findings: Secondary | ICD-10-CM | POA: Diagnosis not present

## 2020-05-30 DIAGNOSIS — E1122 Type 2 diabetes mellitus with diabetic chronic kidney disease: Secondary | ICD-10-CM | POA: Diagnosis not present

## 2020-05-30 DIAGNOSIS — G4700 Insomnia, unspecified: Secondary | ICD-10-CM | POA: Diagnosis not present

## 2020-05-30 DIAGNOSIS — E785 Hyperlipidemia, unspecified: Secondary | ICD-10-CM | POA: Diagnosis not present

## 2020-05-30 DIAGNOSIS — Z7984 Long term (current) use of oral hypoglycemic drugs: Secondary | ICD-10-CM | POA: Diagnosis not present

## 2020-05-30 DIAGNOSIS — M459 Ankylosing spondylitis of unspecified sites in spine: Secondary | ICD-10-CM | POA: Diagnosis not present

## 2020-05-30 DIAGNOSIS — Z1389 Encounter for screening for other disorder: Secondary | ICD-10-CM | POA: Diagnosis not present

## 2020-07-02 DIAGNOSIS — W19XXXA Unspecified fall, initial encounter: Secondary | ICD-10-CM | POA: Diagnosis not present

## 2020-07-02 DIAGNOSIS — S7012XA Contusion of left thigh, initial encounter: Secondary | ICD-10-CM | POA: Diagnosis not present

## 2020-07-06 DIAGNOSIS — M791 Myalgia, unspecified site: Secondary | ICD-10-CM | POA: Diagnosis not present

## 2020-07-06 DIAGNOSIS — M459 Ankylosing spondylitis of unspecified sites in spine: Secondary | ICD-10-CM | POA: Diagnosis not present

## 2020-07-06 DIAGNOSIS — E119 Type 2 diabetes mellitus without complications: Secondary | ICD-10-CM | POA: Diagnosis not present

## 2020-07-06 DIAGNOSIS — R768 Other specified abnormal immunological findings in serum: Secondary | ICD-10-CM | POA: Diagnosis not present

## 2020-07-06 DIAGNOSIS — M7989 Other specified soft tissue disorders: Secondary | ICD-10-CM | POA: Diagnosis not present

## 2020-07-06 DIAGNOSIS — M79643 Pain in unspecified hand: Secondary | ICD-10-CM | POA: Diagnosis not present

## 2020-07-06 DIAGNOSIS — M549 Dorsalgia, unspecified: Secondary | ICD-10-CM | POA: Diagnosis not present

## 2020-07-28 DIAGNOSIS — L03115 Cellulitis of right lower limb: Secondary | ICD-10-CM | POA: Diagnosis not present

## 2020-07-28 DIAGNOSIS — T63441A Toxic effect of venom of bees, accidental (unintentional), initial encounter: Secondary | ICD-10-CM | POA: Diagnosis not present

## 2020-07-28 DIAGNOSIS — L299 Pruritus, unspecified: Secondary | ICD-10-CM | POA: Diagnosis not present

## 2020-09-05 DIAGNOSIS — Z79899 Other long term (current) drug therapy: Secondary | ICD-10-CM | POA: Diagnosis not present

## 2020-09-05 DIAGNOSIS — M7710 Lateral epicondylitis, unspecified elbow: Secondary | ICD-10-CM | POA: Diagnosis not present

## 2020-09-05 DIAGNOSIS — E119 Type 2 diabetes mellitus without complications: Secondary | ICD-10-CM | POA: Diagnosis not present

## 2020-09-05 DIAGNOSIS — M79643 Pain in unspecified hand: Secondary | ICD-10-CM | POA: Diagnosis not present

## 2020-09-05 DIAGNOSIS — R768 Other specified abnormal immunological findings in serum: Secondary | ICD-10-CM | POA: Diagnosis not present

## 2020-09-05 DIAGNOSIS — M461 Sacroiliitis, not elsewhere classified: Secondary | ICD-10-CM | POA: Diagnosis not present

## 2020-09-05 DIAGNOSIS — M7989 Other specified soft tissue disorders: Secondary | ICD-10-CM | POA: Diagnosis not present

## 2020-09-05 DIAGNOSIS — M459 Ankylosing spondylitis of unspecified sites in spine: Secondary | ICD-10-CM | POA: Diagnosis not present

## 2020-09-05 DIAGNOSIS — M791 Myalgia, unspecified site: Secondary | ICD-10-CM | POA: Diagnosis not present

## 2020-09-05 DIAGNOSIS — M549 Dorsalgia, unspecified: Secondary | ICD-10-CM | POA: Diagnosis not present

## 2020-10-28 DIAGNOSIS — R059 Cough, unspecified: Secondary | ICD-10-CM | POA: Diagnosis not present

## 2020-10-28 DIAGNOSIS — R0981 Nasal congestion: Secondary | ICD-10-CM | POA: Diagnosis not present

## 2020-10-28 DIAGNOSIS — J029 Acute pharyngitis, unspecified: Secondary | ICD-10-CM | POA: Diagnosis not present

## 2020-10-28 DIAGNOSIS — Z03818 Encounter for observation for suspected exposure to other biological agents ruled out: Secondary | ICD-10-CM | POA: Diagnosis not present

## 2020-11-08 DIAGNOSIS — I10 Essential (primary) hypertension: Secondary | ICD-10-CM | POA: Diagnosis not present

## 2020-11-08 DIAGNOSIS — E1122 Type 2 diabetes mellitus with diabetic chronic kidney disease: Secondary | ICD-10-CM | POA: Diagnosis not present

## 2020-11-08 DIAGNOSIS — E785 Hyperlipidemia, unspecified: Secondary | ICD-10-CM | POA: Diagnosis not present

## 2020-11-08 DIAGNOSIS — G4700 Insomnia, unspecified: Secondary | ICD-10-CM | POA: Diagnosis not present

## 2020-11-08 DIAGNOSIS — R059 Cough, unspecified: Secondary | ICD-10-CM | POA: Diagnosis not present

## 2020-11-08 DIAGNOSIS — N182 Chronic kidney disease, stage 2 (mild): Secondary | ICD-10-CM | POA: Diagnosis not present

## 2020-11-08 DIAGNOSIS — Z7984 Long term (current) use of oral hypoglycemic drugs: Secondary | ICD-10-CM | POA: Diagnosis not present

## 2020-12-13 DIAGNOSIS — M7989 Other specified soft tissue disorders: Secondary | ICD-10-CM | POA: Diagnosis not present

## 2020-12-13 DIAGNOSIS — E119 Type 2 diabetes mellitus without complications: Secondary | ICD-10-CM | POA: Diagnosis not present

## 2020-12-13 DIAGNOSIS — M461 Sacroiliitis, not elsewhere classified: Secondary | ICD-10-CM | POA: Diagnosis not present

## 2020-12-13 DIAGNOSIS — M549 Dorsalgia, unspecified: Secondary | ICD-10-CM | POA: Diagnosis not present

## 2020-12-13 DIAGNOSIS — M79643 Pain in unspecified hand: Secondary | ICD-10-CM | POA: Diagnosis not present

## 2020-12-13 DIAGNOSIS — R768 Other specified abnormal immunological findings in serum: Secondary | ICD-10-CM | POA: Diagnosis not present

## 2020-12-13 DIAGNOSIS — Z79899 Other long term (current) drug therapy: Secondary | ICD-10-CM | POA: Diagnosis not present

## 2020-12-13 DIAGNOSIS — M459 Ankylosing spondylitis of unspecified sites in spine: Secondary | ICD-10-CM | POA: Diagnosis not present

## 2020-12-13 DIAGNOSIS — M791 Myalgia, unspecified site: Secondary | ICD-10-CM | POA: Diagnosis not present

## 2021-01-24 DIAGNOSIS — M461 Sacroiliitis, not elsewhere classified: Secondary | ICD-10-CM | POA: Diagnosis not present

## 2021-01-24 DIAGNOSIS — E119 Type 2 diabetes mellitus without complications: Secondary | ICD-10-CM | POA: Diagnosis not present

## 2021-01-24 DIAGNOSIS — M7989 Other specified soft tissue disorders: Secondary | ICD-10-CM | POA: Diagnosis not present

## 2021-01-24 DIAGNOSIS — R768 Other specified abnormal immunological findings in serum: Secondary | ICD-10-CM | POA: Diagnosis not present

## 2021-01-24 DIAGNOSIS — M459 Ankylosing spondylitis of unspecified sites in spine: Secondary | ICD-10-CM | POA: Diagnosis not present

## 2021-01-24 DIAGNOSIS — M791 Myalgia, unspecified site: Secondary | ICD-10-CM | POA: Diagnosis not present

## 2021-01-24 DIAGNOSIS — M549 Dorsalgia, unspecified: Secondary | ICD-10-CM | POA: Diagnosis not present

## 2021-01-24 DIAGNOSIS — M79643 Pain in unspecified hand: Secondary | ICD-10-CM | POA: Diagnosis not present

## 2021-01-24 DIAGNOSIS — M25511 Pain in right shoulder: Secondary | ICD-10-CM | POA: Diagnosis not present

## 2021-01-24 DIAGNOSIS — Z79899 Other long term (current) drug therapy: Secondary | ICD-10-CM | POA: Diagnosis not present

## 2021-04-25 DIAGNOSIS — M461 Sacroiliitis, not elsewhere classified: Secondary | ICD-10-CM | POA: Diagnosis not present

## 2021-04-25 DIAGNOSIS — M459 Ankylosing spondylitis of unspecified sites in spine: Secondary | ICD-10-CM | POA: Diagnosis not present

## 2021-04-25 DIAGNOSIS — M791 Myalgia, unspecified site: Secondary | ICD-10-CM | POA: Diagnosis not present

## 2021-04-25 DIAGNOSIS — E119 Type 2 diabetes mellitus without complications: Secondary | ICD-10-CM | POA: Diagnosis not present

## 2021-04-25 DIAGNOSIS — Z79899 Other long term (current) drug therapy: Secondary | ICD-10-CM | POA: Diagnosis not present

## 2021-04-25 DIAGNOSIS — M1990 Unspecified osteoarthritis, unspecified site: Secondary | ICD-10-CM | POA: Diagnosis not present

## 2021-04-25 DIAGNOSIS — I10 Essential (primary) hypertension: Secondary | ICD-10-CM | POA: Diagnosis not present

## 2021-04-25 DIAGNOSIS — M25511 Pain in right shoulder: Secondary | ICD-10-CM | POA: Diagnosis not present

## 2021-04-25 DIAGNOSIS — M549 Dorsalgia, unspecified: Secondary | ICD-10-CM | POA: Diagnosis not present

## 2021-04-25 DIAGNOSIS — R768 Other specified abnormal immunological findings in serum: Secondary | ICD-10-CM | POA: Diagnosis not present

## 2021-05-17 DIAGNOSIS — J209 Acute bronchitis, unspecified: Secondary | ICD-10-CM | POA: Diagnosis not present

## 2021-06-02 DIAGNOSIS — E1122 Type 2 diabetes mellitus with diabetic chronic kidney disease: Secondary | ICD-10-CM | POA: Diagnosis not present

## 2021-06-02 DIAGNOSIS — Z86010 Personal history of colonic polyps: Secondary | ICD-10-CM | POA: Diagnosis not present

## 2021-06-02 DIAGNOSIS — I10 Essential (primary) hypertension: Secondary | ICD-10-CM | POA: Diagnosis not present

## 2021-06-02 DIAGNOSIS — Z23 Encounter for immunization: Secondary | ICD-10-CM | POA: Diagnosis not present

## 2021-06-02 DIAGNOSIS — E785 Hyperlipidemia, unspecified: Secondary | ICD-10-CM | POA: Diagnosis not present

## 2021-06-02 DIAGNOSIS — Z0000 Encounter for general adult medical examination without abnormal findings: Secondary | ICD-10-CM | POA: Diagnosis not present

## 2021-06-02 DIAGNOSIS — N1831 Chronic kidney disease, stage 3a: Secondary | ICD-10-CM | POA: Diagnosis not present

## 2021-06-02 DIAGNOSIS — M459 Ankylosing spondylitis of unspecified sites in spine: Secondary | ICD-10-CM | POA: Diagnosis not present

## 2021-06-02 DIAGNOSIS — G4700 Insomnia, unspecified: Secondary | ICD-10-CM | POA: Diagnosis not present

## 2021-10-05 DIAGNOSIS — L559 Sunburn, unspecified: Secondary | ICD-10-CM | POA: Diagnosis not present

## 2021-10-16 DIAGNOSIS — M549 Dorsalgia, unspecified: Secondary | ICD-10-CM | POA: Diagnosis not present

## 2021-10-16 DIAGNOSIS — M1990 Unspecified osteoarthritis, unspecified site: Secondary | ICD-10-CM | POA: Diagnosis not present

## 2021-10-16 DIAGNOSIS — M461 Sacroiliitis, not elsewhere classified: Secondary | ICD-10-CM | POA: Diagnosis not present

## 2021-10-16 DIAGNOSIS — M791 Myalgia, unspecified site: Secondary | ICD-10-CM | POA: Diagnosis not present

## 2021-10-16 DIAGNOSIS — M459 Ankylosing spondylitis of unspecified sites in spine: Secondary | ICD-10-CM | POA: Diagnosis not present

## 2021-10-16 DIAGNOSIS — M25511 Pain in right shoulder: Secondary | ICD-10-CM | POA: Diagnosis not present

## 2021-10-16 DIAGNOSIS — R768 Other specified abnormal immunological findings in serum: Secondary | ICD-10-CM | POA: Diagnosis not present

## 2021-10-16 DIAGNOSIS — Z79899 Other long term (current) drug therapy: Secondary | ICD-10-CM | POA: Diagnosis not present

## 2021-10-16 DIAGNOSIS — E119 Type 2 diabetes mellitus without complications: Secondary | ICD-10-CM | POA: Diagnosis not present

## 2022-01-01 DIAGNOSIS — E785 Hyperlipidemia, unspecified: Secondary | ICD-10-CM | POA: Diagnosis not present

## 2022-01-01 DIAGNOSIS — E1122 Type 2 diabetes mellitus with diabetic chronic kidney disease: Secondary | ICD-10-CM | POA: Diagnosis not present

## 2022-01-01 DIAGNOSIS — G4700 Insomnia, unspecified: Secondary | ICD-10-CM | POA: Diagnosis not present

## 2022-01-01 DIAGNOSIS — I10 Essential (primary) hypertension: Secondary | ICD-10-CM | POA: Diagnosis not present

## 2022-01-01 DIAGNOSIS — Z23 Encounter for immunization: Secondary | ICD-10-CM | POA: Diagnosis not present

## 2022-01-01 DIAGNOSIS — M459 Ankylosing spondylitis of unspecified sites in spine: Secondary | ICD-10-CM | POA: Diagnosis not present

## 2022-01-01 DIAGNOSIS — N1831 Chronic kidney disease, stage 3a: Secondary | ICD-10-CM | POA: Diagnosis not present

## 2022-01-24 DIAGNOSIS — M1990 Unspecified osteoarthritis, unspecified site: Secondary | ICD-10-CM | POA: Diagnosis not present

## 2022-01-24 DIAGNOSIS — M791 Myalgia, unspecified site: Secondary | ICD-10-CM | POA: Diagnosis not present

## 2022-01-24 DIAGNOSIS — R944 Abnormal results of kidney function studies: Secondary | ICD-10-CM | POA: Diagnosis not present

## 2022-01-24 DIAGNOSIS — Z79899 Other long term (current) drug therapy: Secondary | ICD-10-CM | POA: Diagnosis not present

## 2022-01-24 DIAGNOSIS — M461 Sacroiliitis, not elsewhere classified: Secondary | ICD-10-CM | POA: Diagnosis not present

## 2022-01-24 DIAGNOSIS — R768 Other specified abnormal immunological findings in serum: Secondary | ICD-10-CM | POA: Diagnosis not present

## 2022-01-24 DIAGNOSIS — M459 Ankylosing spondylitis of unspecified sites in spine: Secondary | ICD-10-CM | POA: Diagnosis not present

## 2022-01-24 DIAGNOSIS — M25511 Pain in right shoulder: Secondary | ICD-10-CM | POA: Diagnosis not present

## 2022-01-24 DIAGNOSIS — M549 Dorsalgia, unspecified: Secondary | ICD-10-CM | POA: Diagnosis not present

## 2022-04-06 DIAGNOSIS — E1122 Type 2 diabetes mellitus with diabetic chronic kidney disease: Secondary | ICD-10-CM | POA: Diagnosis not present

## 2022-06-28 DIAGNOSIS — M25511 Pain in right shoulder: Secondary | ICD-10-CM | POA: Diagnosis not present

## 2022-06-28 DIAGNOSIS — M797 Fibromyalgia: Secondary | ICD-10-CM | POA: Diagnosis not present

## 2022-06-28 DIAGNOSIS — N1831 Chronic kidney disease, stage 3a: Secondary | ICD-10-CM | POA: Diagnosis not present

## 2022-06-28 DIAGNOSIS — R768 Other specified abnormal immunological findings in serum: Secondary | ICD-10-CM | POA: Diagnosis not present

## 2022-06-28 DIAGNOSIS — M7710 Lateral epicondylitis, unspecified elbow: Secondary | ICD-10-CM | POA: Diagnosis not present

## 2022-06-28 DIAGNOSIS — M1990 Unspecified osteoarthritis, unspecified site: Secondary | ICD-10-CM | POA: Diagnosis not present

## 2022-06-28 DIAGNOSIS — Z79899 Other long term (current) drug therapy: Secondary | ICD-10-CM | POA: Diagnosis not present

## 2022-06-28 DIAGNOSIS — M461 Sacroiliitis, not elsewhere classified: Secondary | ICD-10-CM | POA: Diagnosis not present

## 2022-06-28 DIAGNOSIS — M549 Dorsalgia, unspecified: Secondary | ICD-10-CM | POA: Diagnosis not present

## 2022-06-28 DIAGNOSIS — M459 Ankylosing spondylitis of unspecified sites in spine: Secondary | ICD-10-CM | POA: Diagnosis not present

## 2022-06-28 DIAGNOSIS — M25521 Pain in right elbow: Secondary | ICD-10-CM | POA: Diagnosis not present

## 2022-07-03 DIAGNOSIS — M459 Ankylosing spondylitis of unspecified sites in spine: Secondary | ICD-10-CM | POA: Diagnosis not present

## 2022-07-03 DIAGNOSIS — Z0000 Encounter for general adult medical examination without abnormal findings: Secondary | ICD-10-CM | POA: Diagnosis not present

## 2022-07-03 DIAGNOSIS — E1122 Type 2 diabetes mellitus with diabetic chronic kidney disease: Secondary | ICD-10-CM | POA: Diagnosis not present

## 2022-07-03 DIAGNOSIS — R21 Rash and other nonspecific skin eruption: Secondary | ICD-10-CM | POA: Diagnosis not present

## 2022-07-03 DIAGNOSIS — E785 Hyperlipidemia, unspecified: Secondary | ICD-10-CM | POA: Diagnosis not present

## 2022-07-03 DIAGNOSIS — N1831 Chronic kidney disease, stage 3a: Secondary | ICD-10-CM | POA: Diagnosis not present

## 2022-07-03 DIAGNOSIS — G4700 Insomnia, unspecified: Secondary | ICD-10-CM | POA: Diagnosis not present

## 2022-07-03 DIAGNOSIS — D84821 Immunodeficiency due to drugs: Secondary | ICD-10-CM | POA: Diagnosis not present

## 2022-07-03 DIAGNOSIS — I10 Essential (primary) hypertension: Secondary | ICD-10-CM | POA: Diagnosis not present

## 2022-10-01 DIAGNOSIS — M459 Ankylosing spondylitis of unspecified sites in spine: Secondary | ICD-10-CM | POA: Diagnosis not present

## 2022-10-01 DIAGNOSIS — N1831 Chronic kidney disease, stage 3a: Secondary | ICD-10-CM | POA: Diagnosis not present

## 2022-10-01 DIAGNOSIS — M797 Fibromyalgia: Secondary | ICD-10-CM | POA: Diagnosis not present

## 2022-10-01 DIAGNOSIS — M461 Sacroiliitis, not elsewhere classified: Secondary | ICD-10-CM | POA: Diagnosis not present

## 2022-10-01 DIAGNOSIS — M25511 Pain in right shoulder: Secondary | ICD-10-CM | POA: Diagnosis not present

## 2022-10-01 DIAGNOSIS — M1990 Unspecified osteoarthritis, unspecified site: Secondary | ICD-10-CM | POA: Diagnosis not present

## 2022-10-01 DIAGNOSIS — Z79899 Other long term (current) drug therapy: Secondary | ICD-10-CM | POA: Diagnosis not present

## 2022-10-01 DIAGNOSIS — M549 Dorsalgia, unspecified: Secondary | ICD-10-CM | POA: Diagnosis not present

## 2022-10-01 DIAGNOSIS — L403 Pustulosis palmaris et plantaris: Secondary | ICD-10-CM | POA: Diagnosis not present

## 2022-10-01 DIAGNOSIS — R768 Other specified abnormal immunological findings in serum: Secondary | ICD-10-CM | POA: Diagnosis not present

## 2022-11-12 DIAGNOSIS — M25511 Pain in right shoulder: Secondary | ICD-10-CM | POA: Diagnosis not present

## 2022-11-12 DIAGNOSIS — Z79899 Other long term (current) drug therapy: Secondary | ICD-10-CM | POA: Diagnosis not present

## 2022-11-12 DIAGNOSIS — M1990 Unspecified osteoarthritis, unspecified site: Secondary | ICD-10-CM | POA: Diagnosis not present

## 2022-11-12 DIAGNOSIS — M549 Dorsalgia, unspecified: Secondary | ICD-10-CM | POA: Diagnosis not present

## 2022-11-12 DIAGNOSIS — M461 Sacroiliitis, not elsewhere classified: Secondary | ICD-10-CM | POA: Diagnosis not present

## 2022-11-12 DIAGNOSIS — N1831 Chronic kidney disease, stage 3a: Secondary | ICD-10-CM | POA: Diagnosis not present

## 2022-11-12 DIAGNOSIS — R768 Other specified abnormal immunological findings in serum: Secondary | ICD-10-CM | POA: Diagnosis not present

## 2022-11-12 DIAGNOSIS — M797 Fibromyalgia: Secondary | ICD-10-CM | POA: Diagnosis not present

## 2022-11-12 DIAGNOSIS — L403 Pustulosis palmaris et plantaris: Secondary | ICD-10-CM | POA: Diagnosis not present

## 2022-11-12 DIAGNOSIS — M459 Ankylosing spondylitis of unspecified sites in spine: Secondary | ICD-10-CM | POA: Diagnosis not present

## 2022-12-23 DIAGNOSIS — Z1211 Encounter for screening for malignant neoplasm of colon: Secondary | ICD-10-CM | POA: Diagnosis not present

## 2022-12-24 DIAGNOSIS — I10 Essential (primary) hypertension: Secondary | ICD-10-CM | POA: Diagnosis not present

## 2022-12-24 DIAGNOSIS — E1122 Type 2 diabetes mellitus with diabetic chronic kidney disease: Secondary | ICD-10-CM | POA: Diagnosis not present

## 2022-12-24 DIAGNOSIS — G4700 Insomnia, unspecified: Secondary | ICD-10-CM | POA: Diagnosis not present

## 2022-12-24 DIAGNOSIS — N1831 Chronic kidney disease, stage 3a: Secondary | ICD-10-CM | POA: Diagnosis not present

## 2022-12-24 DIAGNOSIS — R300 Dysuria: Secondary | ICD-10-CM | POA: Diagnosis not present

## 2022-12-24 DIAGNOSIS — E785 Hyperlipidemia, unspecified: Secondary | ICD-10-CM | POA: Diagnosis not present

## 2022-12-24 DIAGNOSIS — M459 Ankylosing spondylitis of unspecified sites in spine: Secondary | ICD-10-CM | POA: Diagnosis not present

## 2023-02-12 DIAGNOSIS — M461 Sacroiliitis, not elsewhere classified: Secondary | ICD-10-CM | POA: Diagnosis not present

## 2023-02-12 DIAGNOSIS — Z79899 Other long term (current) drug therapy: Secondary | ICD-10-CM | POA: Diagnosis not present

## 2023-02-12 DIAGNOSIS — M1990 Unspecified osteoarthritis, unspecified site: Secondary | ICD-10-CM | POA: Diagnosis not present

## 2023-02-12 DIAGNOSIS — M797 Fibromyalgia: Secondary | ICD-10-CM | POA: Diagnosis not present

## 2023-02-12 DIAGNOSIS — M25511 Pain in right shoulder: Secondary | ICD-10-CM | POA: Diagnosis not present

## 2023-02-12 DIAGNOSIS — M459 Ankylosing spondylitis of unspecified sites in spine: Secondary | ICD-10-CM | POA: Diagnosis not present

## 2023-02-12 DIAGNOSIS — R768 Other specified abnormal immunological findings in serum: Secondary | ICD-10-CM | POA: Diagnosis not present

## 2023-02-12 DIAGNOSIS — M549 Dorsalgia, unspecified: Secondary | ICD-10-CM | POA: Diagnosis not present

## 2023-02-12 DIAGNOSIS — N1831 Chronic kidney disease, stage 3a: Secondary | ICD-10-CM | POA: Diagnosis not present

## 2023-02-12 DIAGNOSIS — L403 Pustulosis palmaris et plantaris: Secondary | ICD-10-CM | POA: Diagnosis not present

## 2023-04-17 DIAGNOSIS — M545 Low back pain, unspecified: Secondary | ICD-10-CM | POA: Diagnosis not present

## 2023-04-17 DIAGNOSIS — M47896 Other spondylosis, lumbar region: Secondary | ICD-10-CM | POA: Diagnosis not present

## 2023-05-07 ENCOUNTER — Ambulatory Visit: Admitting: Physical Therapy

## 2023-05-20 DIAGNOSIS — M797 Fibromyalgia: Secondary | ICD-10-CM | POA: Diagnosis not present

## 2023-05-20 DIAGNOSIS — L403 Pustulosis palmaris et plantaris: Secondary | ICD-10-CM | POA: Diagnosis not present

## 2023-05-20 DIAGNOSIS — M459 Ankylosing spondylitis of unspecified sites in spine: Secondary | ICD-10-CM | POA: Diagnosis not present

## 2023-05-20 DIAGNOSIS — M461 Sacroiliitis, not elsewhere classified: Secondary | ICD-10-CM | POA: Diagnosis not present

## 2023-05-20 DIAGNOSIS — M25511 Pain in right shoulder: Secondary | ICD-10-CM | POA: Diagnosis not present

## 2023-05-20 DIAGNOSIS — M1990 Unspecified osteoarthritis, unspecified site: Secondary | ICD-10-CM | POA: Diagnosis not present

## 2023-05-20 DIAGNOSIS — R768 Other specified abnormal immunological findings in serum: Secondary | ICD-10-CM | POA: Diagnosis not present

## 2023-05-20 DIAGNOSIS — N1831 Chronic kidney disease, stage 3a: Secondary | ICD-10-CM | POA: Diagnosis not present

## 2023-05-20 DIAGNOSIS — M549 Dorsalgia, unspecified: Secondary | ICD-10-CM | POA: Diagnosis not present

## 2023-05-20 DIAGNOSIS — Z79899 Other long term (current) drug therapy: Secondary | ICD-10-CM | POA: Diagnosis not present

## 2023-05-27 ENCOUNTER — Ambulatory Visit: Attending: Sports Medicine

## 2023-05-27 DIAGNOSIS — M5459 Other low back pain: Secondary | ICD-10-CM | POA: Insufficient documentation

## 2023-05-27 DIAGNOSIS — R29898 Other symptoms and signs involving the musculoskeletal system: Secondary | ICD-10-CM | POA: Diagnosis not present

## 2023-05-27 DIAGNOSIS — M4317 Spondylolisthesis, lumbosacral region: Secondary | ICD-10-CM | POA: Insufficient documentation

## 2023-05-27 NOTE — Therapy (Signed)
 OUTPATIENT PHYSICAL THERAPY THORACOLUMBAR EVALUATION   Patient Name: [name] MRN: [id number] DOB:Sep 09, 1965, 58 y.o., female Today's Date: 05/27/2023  END OF SESSION:  PT End of Session - 05/27/23 1537     Visit Number 1    Number of Visits 12    Date for PT Re-Evaluation 07/27/23    Authorization Type [employer] MCR    PT Start Time 1450    PT Stop Time 1535    PT Time Calculation (min) 45 min    Activity Tolerance Patient tolerated treatment well    Behavior During Therapy WFL for tasks assessed/performed             Past Medical History:  Diagnosis Date   Abdominal pain    Anxiety state, unspecified    Chronic kidney disease, stage II (mild)    Depression with anxiety    Diarrhea    Essential hypertension, benign    stable   Hypercholesteremia    Hyperlipemia    Insomnia    Obesity    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   PCP: Sun, Vyvyan, MD   REFERRING PROVIDER: Stafford Eagles, MD  REFERRING DIAG: M54.50 (ICD-10-CM) - Low back pain, unspecified  Rationale for Evaluation and Treatment: Rehabilitation  THERAPY DIAG:  Other low back pain  Spondylolisthesis of lumbosacral region  Weakness of back  ONSET DATE: chronic  SUBJECTIVE:                                                                                                                                                                                           SUBJECTIVE STATEMENT: Patient relates a chronic history of low back pain w/o radicular symptoms, worsening over time.  Denies leg pain but PMHx of coccyx removal with some residual l/s discomfort.  PERTINENT HISTORY:  She does have  probable multifactorial low back pain. 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 She does have a degenerative scoliosis and a probable grade 1 anterolisthesis L4 and L5. We will treat her aggressively, but conservatively. I will get her started in formal physical therapy. I will hold off any medications for now. Follow-up in 5 to 6 weeks to see her progress. If she is not improving may consider further diagnostics with MRI. However, she reports that she is extremely  claustrophobic. I may need to have her see a spine specialist as well   PAIN:  Are you having pain? Yes: NPRS scale: 5-9/10 Pain location: low back Pain description: ache Aggravating factors: stair climbing, prolonged sitting/standing Relieving factors: supine positions  PRECAUTIONS: None  RED FLAGS: None   WEIGHT BEARING RESTRICTIONS: No  FALLS:  Has patient fallen in last 6 months? No  OCCUPATION: not working  PLOF: Independent  PATIENT GOALS: To manage my back pain  NEXT MD VISIT: TBD  OBJECTIVE:  Note: Objective measures were completed at Evaluation unless otherwise noted.  DIAGNOSTIC FINDINGS:  none  PATIENT SURVEYS:  Modified Oswestry 20/50   MUSCLE LENGTH: Hamstrings: Right 80 deg; Left 80 deg PKB painful B  POSTURE: No Significant postural limitations  PALPATION: deferred  LUMBAR ROM:   AROM eval  Flexion 50%  Extension 50%  Right lateral flexion 75%  Left lateral flexion 75%  Right rotation 75%  Left rotation 75%   (Blank rows = not tested)  LOWER EXTREMITY ROM:     Active  Right eval Left eval  Hip flexion 110d 110d  Hip extension    Hip abduction    Hip adduction    Hip internal rotation    Hip external rotation    Knee flexion    Knee extension    Ankle dorsiflexion    Ankle plantarflexion    Ankle inversion    Ankle eversion     (Blank rows = not tested)  LOWER EXTREMITY MMT:    MMT Right eval Left eval  Hip flexion 4 4  Hip  extension    Hip abduction 4 4  Hip adduction    Hip internal rotation    Hip external rotation    Knee flexion 4 4  Knee extension 4 4  Ankle dorsiflexion    Ankle plantarflexion 4 4  Ankle inversion    Ankle eversion     (Blank rows = not tested)  LUMBAR SPECIAL TESTS:  Straight leg raise test: Negative, Slump test: painful on R, and FABER test: restricted/painful R PKB restricted and painful B  FUNCTIONAL TESTS:  30 seconds chair stand test 7 arms crossed  GAIT: Distance walked: 23ft x2 Assistive device utilized: None Level of assistance: Complete Independence Comments: slow cadence  TREATMENT DATE:  [facility] Adult PT Treatment:                                                DATE: 05/27/23 Eval and HEP  Self Care: Additional minutes spent for educating on updated Therapeutic Home Exercise Program as well as comparing current status to condition at start of symptoms. This included exercises focusing on stretching, strengthening, with focus on eccentric aspects. Long term goals include an improvement in range of motion, strength, endurance as well as avoiding reinjury. Patient's frequency would include in 1-2 times a day, 3-5 times a week for a duration of 6-12 weeks. Proper technique shown and discussed handout in great detail. All questions were discussed and addressed.  PATIENT EDUCATION:  Education details: Discussed eval findings, rehab rationale and POC and patient is in agreement  Person educated: Patient Education method: Explanation Education comprehension: verbalized understanding  HOME EXERCISE PROGRAM: Access Code: [email or web address] URL: [email or web address] Date: 05/27/2023 Prepared by: Gretta Leavens  Exercises - Seated Table Hamstring Stretch  - 2 x daily - 5 x weekly - 1 sets - 2 reps - 30s hold - Hip Flexor Stretch at Edge  of Bed  - 2 x daily - 5 x weekly - 1 sets - 10 reps - 30s hold - Supine 90/90 Abdominal Bracing  - 2 x daily - 5 x weekly - 1 sets - 2-3 reps - 30s hold  ASSESSMENT:  CLINICAL IMPRESSION: Patient is a 58 y.o. female who was seen today for physical therapy evaluation and treatment for chronic low back pain with a history of ankylosing spondylitis and scoliosis.  No distinct neural tension signs elicited, moderate ROM restrictions noted in flexion/extension as well as B hip flexors due to soft tissue tightness as opposed to pain.  LE and core strength deficits noted and appear to contribute to lumbar symptoms  OBJECTIVE IMPAIRMENTS: decreased activity tolerance, decreased endurance, decreased knowledge of condition, decreased mobility, decreased ROM, decreased strength, impaired perceived functional ability, and pain.   ACTIVITY LIMITATIONS: carrying, lifting, bending, sitting, standing, sleeping, stairs, and transfers  PERSONAL FACTORS: Age, Fitness, Past/current experiences, Time since onset of injury/illness/exacerbation, and 1 comorbidity: AS  are also affecting patient's functional outcome.   REHAB POTENTIAL: Good  CLINICAL DECISION MAKING: Evolving/moderate complexity  EVALUATION COMPLEXITY: Low   GOALS: Goals reviewed with patient? No  SHORT TERM GOALS: Target date: 06/17/2023    Patient to demonstrate independence in HEP  Baseline: [id number] Goal status: INITIAL  2.  Increase trunk flex/ext to 75% Baseline:  AROM eval  Flexion 50%  Extension 50%  Right lateral flexion 75%  Left lateral flexion 75%  Right rotation 75%  Left rotation 75%   Goal status: INITIAL   LONG TERM GOALS: Target date: 07/08/2023    Patient will increase 30s chair stand reps from 7 to 10 without arms to demonstrate and improved functional ability with less pain/difficulty as well as reduce fall risk.  Baseline:  Goal status: INITIAL  2.  Patient will acknowledge 4/10 pain at least once during  episode of care   Baseline: 5-9/10 Goal status: INITIAL  3.  Patient will score at least 12/50 on ODI to signify clinically meaningful improvement in functional abilities.   Baseline: 20/50 Goal status: INITIAL  4.  Maintain 90/90 position for 60s Baseline: <30s Goal status: INITIAL   PLAN:  PT FREQUENCY: 1-2x/week  PT DURATION: 6 weeks  PLANNED INTERVENTIONS: 97110-Therapeutic exercises, 97530- Therapeutic activity, 97112- Neuromuscular re-education, 97535- Self Care, [id number]- Manual therapy, Patient/Family education, Dry Needling, Joint mobilization, and Spinal mobilization.  PLAN FOR NEXT SESSION: HEP review and update, manual techniques as appropriate, aerobic tasks, ROM and flexibility activities, strengthening and PREs, TPDN, gait and balance training as needed    Date of referral: 04/19/2023 Referring provider: Stafford Eagles, MD Referring diagnosis? M54.50 (ICD-10-CM) - Low back pain, unspecified Treatment diagnosis? (if different than referring diagnosis) M54.50 (ICD-10-CM) - Low back pain, unspecified  What was this (referring dx) caused by? Ongoing Issue, Arthritis, and Other: ankylosing spondylitis  Nature of Condition: Chronic (continuous duration > 3 months)   Laterality: Both  Current Functional Measure Score: Back Index 20/50  Objective measurements identify impairments when they are compared to normal values, the  uninvolved extremity, and prior level of function.  [x]  Yes  []  No  Objective assessment of functional ability: Moderate functional limitations   Briefly describe symptoms: Patient relates a chronic history of low back pain w/o radicular symptoms, worsening over time.  Denies leg pain but PMHx of coccyx removal with some residual l/s discomfort.  How did symptoms start: progressive degeneration  Average pain intensity:  Last 24 hours: 9  Past week: 5  How often does the pt experience symptoms? Frequently  How much have the symptoms  interfered with usual daily activities? Moderately  How has condition changed since care began at this facility? NA - initial visit  In general, how is the patients overall health? Good   BACK PAIN (STarT Back Screening Tool) Has pain spread down the leg(s) at some time in the last 2 weeks? n Has there been pain in the shoulder or neck at some time in the last 2 weeks? n Has the pt only walked short distances because of back pain? y Has patient dressed more slowly because of back pain in the past 2 weeks? y Does patient think it's not safe for a person with this condition to be physically active? n Does patient have worrying thoughts a lot of the time? n Does patient feel back pain is terrible and will never get any better? n Has patient stopped enjoying things they usually enjoy? y    Eldon Greenland, PT 05/27/2023, 3:47 PM

## 2023-06-04 ENCOUNTER — Ambulatory Visit

## 2023-06-06 ENCOUNTER — Encounter

## 2023-06-11 ENCOUNTER — Encounter

## 2023-06-12 ENCOUNTER — Ambulatory Visit

## 2023-06-18 ENCOUNTER — Ambulatory Visit

## 2023-06-20 ENCOUNTER — Ambulatory Visit

## 2023-06-25 ENCOUNTER — Encounter

## 2023-06-26 DIAGNOSIS — D631 Anemia in chronic kidney disease: Secondary | ICD-10-CM | POA: Diagnosis not present

## 2023-06-26 DIAGNOSIS — N2581 Secondary hyperparathyroidism of renal origin: Secondary | ICD-10-CM | POA: Diagnosis not present

## 2023-06-26 DIAGNOSIS — E1122 Type 2 diabetes mellitus with diabetic chronic kidney disease: Secondary | ICD-10-CM | POA: Diagnosis not present

## 2023-06-26 DIAGNOSIS — N1832 Chronic kidney disease, stage 3b: Secondary | ICD-10-CM | POA: Diagnosis not present

## 2023-06-26 DIAGNOSIS — R768 Other specified abnormal immunological findings in serum: Secondary | ICD-10-CM | POA: Diagnosis not present

## 2023-06-26 DIAGNOSIS — I129 Hypertensive chronic kidney disease with stage 1 through stage 4 chronic kidney disease, or unspecified chronic kidney disease: Secondary | ICD-10-CM | POA: Diagnosis not present

## 2023-06-26 DIAGNOSIS — M459 Ankylosing spondylitis of unspecified sites in spine: Secondary | ICD-10-CM | POA: Diagnosis not present

## 2023-06-27 ENCOUNTER — Encounter

## 2023-06-28 ENCOUNTER — Other Ambulatory Visit: Payer: Self-pay | Admitting: Nephrology

## 2023-06-28 DIAGNOSIS — N1832 Chronic kidney disease, stage 3b: Secondary | ICD-10-CM

## 2023-07-02 ENCOUNTER — Ambulatory Visit
Admission: RE | Admit: 2023-07-02 | Discharge: 2023-07-02 | Disposition: A | Discharge status: Home / Self Care | Source: Ambulatory Visit | Attending: Nephrology | Admitting: Nephrology

## 2023-07-02 DIAGNOSIS — N1832 Chronic kidney disease, stage 3b: Secondary | ICD-10-CM | POA: Diagnosis not present

## 2023-07-10 DIAGNOSIS — N1831 Chronic kidney disease, stage 3a: Secondary | ICD-10-CM | POA: Diagnosis not present

## 2023-07-10 DIAGNOSIS — G4700 Insomnia, unspecified: Secondary | ICD-10-CM | POA: Diagnosis not present

## 2023-07-10 DIAGNOSIS — D84821 Immunodeficiency due to drugs: Secondary | ICD-10-CM | POA: Diagnosis not present

## 2023-07-10 DIAGNOSIS — Z860100 Personal history of colon polyps, unspecified: Secondary | ICD-10-CM | POA: Diagnosis not present

## 2023-07-10 DIAGNOSIS — M459 Ankylosing spondylitis of unspecified sites in spine: Secondary | ICD-10-CM | POA: Diagnosis not present

## 2023-07-10 DIAGNOSIS — E785 Hyperlipidemia, unspecified: Secondary | ICD-10-CM | POA: Diagnosis not present

## 2023-07-10 DIAGNOSIS — I10 Essential (primary) hypertension: Secondary | ICD-10-CM | POA: Diagnosis not present

## 2023-07-10 DIAGNOSIS — Z1159 Encounter for screening for other viral diseases: Secondary | ICD-10-CM | POA: Diagnosis not present

## 2023-07-10 DIAGNOSIS — Z0000 Encounter for general adult medical examination without abnormal findings: Secondary | ICD-10-CM | POA: Diagnosis not present

## 2023-07-10 DIAGNOSIS — E1122 Type 2 diabetes mellitus with diabetic chronic kidney disease: Secondary | ICD-10-CM | POA: Diagnosis not present

## 2023-07-15 DIAGNOSIS — E1122 Type 2 diabetes mellitus with diabetic chronic kidney disease: Secondary | ICD-10-CM | POA: Diagnosis not present

## 2023-07-15 DIAGNOSIS — E785 Hyperlipidemia, unspecified: Secondary | ICD-10-CM | POA: Diagnosis not present

## 2023-07-15 DIAGNOSIS — I10 Essential (primary) hypertension: Secondary | ICD-10-CM | POA: Diagnosis not present

## 2023-09-03 DIAGNOSIS — L403 Pustulosis palmaris et plantaris: Secondary | ICD-10-CM | POA: Diagnosis not present

## 2023-09-03 DIAGNOSIS — Z79899 Other long term (current) drug therapy: Secondary | ICD-10-CM | POA: Diagnosis not present

## 2023-09-03 DIAGNOSIS — R768 Other specified abnormal immunological findings in serum: Secondary | ICD-10-CM | POA: Diagnosis not present

## 2023-09-03 DIAGNOSIS — N1831 Chronic kidney disease, stage 3a: Secondary | ICD-10-CM | POA: Diagnosis not present

## 2023-09-03 DIAGNOSIS — M1990 Unspecified osteoarthritis, unspecified site: Secondary | ICD-10-CM | POA: Diagnosis not present

## 2023-09-03 DIAGNOSIS — M797 Fibromyalgia: Secondary | ICD-10-CM | POA: Diagnosis not present

## 2023-09-03 DIAGNOSIS — M25511 Pain in right shoulder: Secondary | ICD-10-CM | POA: Diagnosis not present

## 2023-09-03 DIAGNOSIS — M549 Dorsalgia, unspecified: Secondary | ICD-10-CM | POA: Diagnosis not present

## 2023-09-03 DIAGNOSIS — M459 Ankylosing spondylitis of unspecified sites in spine: Secondary | ICD-10-CM | POA: Diagnosis not present

## 2023-09-03 DIAGNOSIS — M461 Sacroiliitis, not elsewhere classified: Secondary | ICD-10-CM | POA: Diagnosis not present
